# Patient Record
Sex: Female | Born: 1950 | Race: White | Hispanic: No | Marital: Married | Smoking: Never smoker
Health system: Southern US, Community
[De-identification: ages and names within clinical notes are randomized; demographics above are authoritative.]

## PROBLEM LIST (undated history)

## (undated) DIAGNOSIS — E7800 Pure hypercholesterolemia, unspecified: Secondary | ICD-10-CM

## (undated) HISTORY — PX: OTHER SURGICAL HISTORY: SHX169

## (undated) HISTORY — PX: CHOLECYSTECTOMY: SHX55

## (undated) HISTORY — PX: ABDOMINAL HYSTERECTOMY: SHX81

## (undated) HISTORY — PX: BACK SURGERY: SHX140

---

## 2012-12-28 ENCOUNTER — Emergency Department (HOSPITAL_COMMUNITY)
Admission: EM | Admit: 2012-12-28 | Discharge: 2012-12-28 | Disposition: A | Discharge status: Home / Self Care | Payer: Medicare PPO | Attending: Emergency Medicine | Admitting: Emergency Medicine

## 2012-12-28 ENCOUNTER — Encounter (HOSPITAL_COMMUNITY): Payer: Self-pay | Admitting: Emergency Medicine

## 2012-12-28 DIAGNOSIS — S61012A Laceration without foreign body of left thumb without damage to nail, initial encounter: Secondary | ICD-10-CM

## 2012-12-28 DIAGNOSIS — Y92009 Unspecified place in unspecified non-institutional (private) residence as the place of occurrence of the external cause: Secondary | ICD-10-CM | POA: Insufficient documentation

## 2012-12-28 DIAGNOSIS — Z79899 Other long term (current) drug therapy: Secondary | ICD-10-CM | POA: Insufficient documentation

## 2012-12-28 DIAGNOSIS — S61209A Unspecified open wound of unspecified finger without damage to nail, initial encounter: Secondary | ICD-10-CM | POA: Insufficient documentation

## 2012-12-28 DIAGNOSIS — E7800 Pure hypercholesterolemia, unspecified: Secondary | ICD-10-CM | POA: Insufficient documentation

## 2012-12-28 DIAGNOSIS — Y9389 Activity, other specified: Secondary | ICD-10-CM | POA: Insufficient documentation

## 2012-12-28 DIAGNOSIS — W268XXA Contact with other sharp object(s), not elsewhere classified, initial encounter: Secondary | ICD-10-CM | POA: Insufficient documentation

## 2012-12-28 HISTORY — DX: Pure hypercholesterolemia, unspecified: E78.00

## 2012-12-28 MED ORDER — LIDOCAINE-EPINEPHRINE (PF) 2 %-1:200000 IJ SOLN
INTRAMUSCULAR | Status: AC
  Administered 2012-12-28: 06:00:00
  Filled 2012-12-28: qty 20

## 2012-12-28 MED ORDER — TETANUS-DIPHTH-ACELL PERTUSSIS 5-2.5-18.5 LF-MCG/0.5 IM SUSP
0.5000 mL | Freq: Once | INTRAMUSCULAR | Status: AC
  Administered 2012-12-28: 0.5 mL via INTRAMUSCULAR
  Filled 2012-12-28: qty 0.5

## 2012-12-28 NOTE — ED Notes (Addendum)
Wound soaked in normal saline and irrigated with Normal Saline.  Blood under the tissue of small flap, no active bleeding at this time. Soaking in saline with small amount of betadine

## 2012-12-28 NOTE — ED Notes (Signed)
States her son applied the dressing, and bleeding through the bandage,  At present, bandage is intact, and blood can be observed inside the bandage.

## 2012-12-28 NOTE — ED Notes (Signed)
Dermabond placed by Dr Rulon Abide.  Laceration clean and dry when patient was discharged.

## 2012-12-28 NOTE — ED Notes (Signed)
Patient states she lacerated her left thumb last night while slicing a ham.

## 2013-01-03 NOTE — ED Provider Notes (Signed)
History     CSN: [id number]  Arrival date & time 12/28/12  4098   First MD Initiated Contact with Patient 12/28/12 [phone]      Chief Complaint  Patient presents with  . Laceration   HPI patient under a significant amount of stress after her mother died earlier in the day in the ICU, presents with a laceration to the left thumb.  Pain is mild to moderate, throbbing, hemostatic, it did bleed quite a bit at home she says. She has no loss of sensation in the thumb. No other alleviating or exacerbating factors and no other associated symptoms.  Past Medical History  Diagnosis Date  . High cholesterol     Past Surgical History  Procedure Laterality Date  . Abdominal hysterectomy    . Cholecystectomy    . Back surgery      No family history on file.  History  Substance Use Topics  . Smoking status: Never Smoker   . Smokeless tobacco: Not on file  . Alcohol Use: No    OB History   Grav Para Term Preterm Abortions TAB SAB Ect Mult Living                  Review of Systems At least 10pt or greater review of systems completed and are negative except where specified in the HPI.  Allergies  Penicillins  Home Medications   Current Outpatient Rx  Name  Route  Sig  Dispense  Refill  . rosuvastatin (CRESTOR) 10 MG tablet   Oral   Take 10 mg by mouth daily.           BP 145/84  Pulse 96  Temp(Src) 98.5 F (36.9 C) (Oral)  Resp 20  Ht 5\' 3"  (1.6 m)  Wt 130 lb (58.968 kg)  BMI 23.03 kg/m2  SpO2 99%  Physical Exam  Nursing notes reviewed.  Electronic medical record reviewed. VITAL SIGNS:   Filed Vitals:   12/28/12 0406  BP: 145/84  Pulse: 96  Temp: 98.5 F (36.9 C)  TempSrc: Oral  Resp: 20  Height: 5\' 3"  (1.6 m)  Weight: 130 lb (58.968 kg)  SpO2: 99%   CONSTITUTIONAL: Awake, oriented, appears non-toxic HENT: Atraumatic, normocephalic, oral mucosa pink and moist, airway patent. Nares patent without drainage. External ears normal. EYES: Conjunctiva clear,  EOMI, PERRLA NECK: Trachea midline, non-tender, supple CARDIOVASCULAR: Normal heart rate, Normal rhythm, No murmurs, rubs, gallops PULMONARY/CHEST: Clear to auscultation, no rhonchi, wheezes, or rales. Symmetrical breath sounds. Non-tender. ABDOMINAL: Non-distended, soft, non-tender - no rebound or guarding.  BS normal. NEUROLOGIC: Non-focal, moving all four extremities, no gross sensory or motor deficits. EXTREMITIES: No clubbing, cyanosis, or edema. Thin laceration to the left thumb on the ulnar aspect of the skin adjacent to the nail, centimeter and a half, hemostatic, no foreign body SKIN: Warm, Dry, No erythema, No rash  ED Course  LACERATION REPAIR Date/Time: 12/28/2012 6:05 AM Performed by: Jones Skene Authorized by: Jones Skene Consent: Verbal consent obtained. Risks and benefits: risks, benefits and alternatives were discussed Patient identity confirmed: verbally with patient and arm band Body area: upper extremity Location details: left thumb Laceration length: 1.5 cm Tendon involvement: none Nerve involvement: none Vascular damage: no Anesthesia method: Washed with lidocaine, 2% without epinephrine. Anesthetic total: 3 ml Patient sedated: no Preparation: Patient was prepped and draped in the usual sterile fashion. Irrigation solution: saline Irrigation method: syringe Amount of cleaning: standard Debridement: none Degree of undermining: none Skin closure: glue Approximation: close  Approximation difficulty: simple Patient tolerance: Patient tolerated the procedure well with no immediate complications.   (including critical care time)  Labs Reviewed - No data to display No results found.   1. Thumb laceration, left, initial encounter       MDM  Laceration washed, this is a thin laceration contains mostly epidermis, amenable to using glue.  Precautions from infection given, discharge stable and good condition. Tetanus updated        Jones Skene, MD 01/03/13 1308

## 2013-10-25 ENCOUNTER — Emergency Department (HOSPITAL_COMMUNITY)
Admission: EM | Admit: 2013-10-25 | Discharge: 2013-10-25 | Disposition: A | Discharge status: Home / Self Care | Payer: Medicare PPO | Attending: Emergency Medicine | Admitting: Emergency Medicine

## 2013-10-25 ENCOUNTER — Encounter (HOSPITAL_COMMUNITY): Payer: Self-pay | Admitting: Emergency Medicine

## 2013-10-25 ENCOUNTER — Emergency Department (HOSPITAL_COMMUNITY): Payer: Medicare PPO

## 2013-10-25 DIAGNOSIS — Z880 Allergy status to penicillin: Secondary | ICD-10-CM | POA: Insufficient documentation

## 2013-10-25 DIAGNOSIS — Z79899 Other long term (current) drug therapy: Secondary | ICD-10-CM | POA: Insufficient documentation

## 2013-10-25 DIAGNOSIS — E7800 Pure hypercholesterolemia, unspecified: Secondary | ICD-10-CM | POA: Insufficient documentation

## 2013-10-25 DIAGNOSIS — R55 Syncope and collapse: Secondary | ICD-10-CM | POA: Insufficient documentation

## 2013-10-25 LAB — BASIC METABOLIC PANEL
BUN: 13 mg/dL (ref 6–23)
CALCIUM: 9.2 mg/dL (ref 8.4–10.5)
CO2: 31 meq/L (ref 19–32)
CREATININE: 0.87 mg/dL (ref 0.50–1.10)
Chloride: 102 mEq/L (ref 96–112)
GFR calc Af Amer: 82 mL/min — ABNORMAL LOW (ref >90)
GFR calc non Af Amer: 70 mL/min — ABNORMAL LOW (ref >90)
GLUCOSE: 98 mg/dL (ref 70–99)
Potassium: 4.1 mEq/L (ref 3.7–5.3)
Sodium: 142 mEq/L (ref 137–147)

## 2013-10-25 LAB — URINALYSIS, ROUTINE W REFLEX MICROSCOPIC
BILIRUBIN URINE: NEGATIVE
GLUCOSE, UA: NEGATIVE mg/dL
KETONES UR: NEGATIVE mg/dL
Leukocytes, UA: NEGATIVE
Nitrite: NEGATIVE
PH: 5.5 (ref 5.0–8.0)
Protein, ur: NEGATIVE mg/dL
Specific Gravity, Urine: 1.02 (ref 1.005–1.030)
Urobilinogen, UA: 0.2 mg/dL (ref 0.0–1.0)

## 2013-10-25 LAB — URINE MICROSCOPIC-ADD ON

## 2013-10-25 LAB — CBC WITH DIFFERENTIAL/PLATELET
BASOS ABS: 0 10*3/uL (ref 0.0–0.1)
Basophils Relative: 0 % (ref 0–1)
EOS PCT: 1 % (ref 0–5)
Eosinophils Absolute: 0.1 10*3/uL (ref 0.0–0.7)
HEMATOCRIT: 41.3 % (ref 36.0–46.0)
Hemoglobin: 13.4 g/dL (ref 12.0–15.0)
LYMPHS PCT: 28 % (ref 12–46)
Lymphs Abs: 1.9 10*3/uL (ref 0.7–4.0)
MCH: 30.1 pg (ref 26.0–34.0)
MCHC: 32.4 g/dL (ref 30.0–36.0)
MCV: 92.8 fL (ref 78.0–100.0)
MONO ABS: 0.5 10*3/uL (ref 0.1–1.0)
Monocytes Relative: 7 % (ref 3–12)
Neutro Abs: 4.3 10*3/uL (ref 1.7–7.7)
Neutrophils Relative %: 63 % (ref 43–77)
Platelets: 213 10*3/uL (ref 150–400)
RBC: 4.45 MIL/uL (ref 3.87–5.11)
RDW: 13.3 % (ref 11.5–15.5)
WBC: 6.8 10*3/uL (ref 4.0–10.5)

## 2013-10-25 LAB — TROPONIN I

## 2013-10-25 LAB — GLUCOSE, CAPILLARY: Glucose-Capillary: 85 mg/dL (ref 70–99)

## 2013-10-25 MED ORDER — MECLIZINE HCL 25 MG PO TABS: 25.0000 mg | ORAL_TABLET | Freq: Three times a day (TID) | ORAL | Status: AC | PRN

## 2013-10-25 NOTE — ED Notes (Signed)
EDP is speaking with pt

## 2013-10-25 NOTE — ED Notes (Signed)
Pt c/o dizziness since Sunday night.  Also reports r sided neck pain for over 1 week.  Reports has had 3 neck surgeries.  Pt has been taking antivert at home.  Pt says this am bent over to wrap a towel around her head and "things went black" and fell to her knees.  Denies any cp or n/v.

## 2013-10-25 NOTE — ED Provider Notes (Signed)
CSN: [id number]     Arrival date & time 10/25/13  1344 History  This chart was scribed for Donnetta Hutching, MD by Leone Payor, ED Scribe. This patient was seen in room [facility]05[facility] and the patient's care was started 3:30 PM.    Chief Complaint  Patient presents with  . Dizziness      The history is provided by the patient. No language interpreter was used.    HPI Comments: Rachel Allen is a 63 y.o. female who presents to the Emergency Department complaining of intermittent episodes of dizziness that began 3 days ago. She reports having an episode after showering when she tilted her head forward to wrap her hair. She reports falling to her knees brief amount of time, but quickly recovered.. She had another episode after waking in the middle of the night. She reports becoming dizzy when she had to give a urine sample in the ED. Nursing note states pt has been taking Antivert at home. She has a history of 3 different neck surgeries located at C4/5, C5/6, and C6/7. She denies chest pain, SOB, nausea, vomiting, extremity weakness, other neurological deficits.   Past Medical History  Diagnosis Date  . High cholesterol    Past Surgical History  Procedure Laterality Date  . Abdominal hysterectomy    . Cholecystectomy    . Back surgery    . Neck surgery x 3     No family history on file. History  Substance Use Topics  . Smoking status: Never Smoker   . Smokeless tobacco: Not on file  . Alcohol Use: No   OB History   Grav Para Term Preterm Abortions TAB SAB Ect Mult Living                 Review of Systems  A complete 10 system review of systems was obtained and all systems are negative except as noted in the HPI and PMH.    Allergies  Penicillins  Home Medications   Current Outpatient Rx  Name  Route  Sig  Dispense  Refill  . PRESCRIPTION MEDICATION      as needed (Cortisone Injection in Shoulder).         . rosuvastatin (CRESTOR) 10 MG tablet   Oral   Take 10 mg by  mouth daily.         Marland Kitchen venlafaxine XR (EFFEXOR-XR) 75 MG 24 hr capsule   Oral   Take 75 mg by mouth daily with breakfast.          BP 109/59  Pulse 73  Temp(Src) 97.8 F (36.6 C) (Oral)  Resp 18  Ht 5\' 4"  (1.626 m)  Wt 125 lb (56.7 kg)  BMI 21.45 kg/m2  SpO2 99% Physical Exam  Nursing note and vitals reviewed. Constitutional: She is oriented to person, place, and time. She appears well-developed and well-nourished.  Appears pale  HENT:  Head: Normocephalic and atraumatic.  Eyes: Conjunctivae and EOM are normal. Pupils are equal, round, and reactive to light.  Neck: Normal range of motion. Neck supple.  Cardiovascular: Normal rate, regular rhythm and normal heart sounds.   Pulmonary/Chest: Effort normal and breath sounds normal.  Abdominal: Soft. Bowel sounds are normal.  Musculoskeletal: Normal range of motion.  Neurological: She is alert and oriented to person, place, and time.  Skin: Skin is warm and dry.  Psychiatric: She has a normal mood and affect. Her behavior is normal.    ED Course  Procedures (including critical care time)  DIAGNOSTIC STUDIES: Oxygen Saturation is 100% on RA, normal by my interpretation.    COORDINATION OF CARE: 3:34 PM Will order head CT and lab work. Discussed treatment plan with pt at bedside and pt agreed to plan.   Labs Review Labs Reviewed  BASIC METABOLIC PANEL - Abnormal; Notable for the following:    GFR calc non Af Amer 70 (*)    GFR calc Af Amer 82 (*)    All other components within normal limits  URINALYSIS, ROUTINE W REFLEX MICROSCOPIC - Abnormal; Notable for the following:    Hgb urine dipstick TRACE (*)    All other components within normal limits  GLUCOSE, CAPILLARY  CBC WITH DIFFERENTIAL  TROPONIN I  URINE MICROSCOPIC-ADD ON   Imaging Review Ct Head Wo Contrast  10/25/2013   CLINICAL DATA:  Unexplained syncope  EXAM: CT HEAD WITHOUT CONTRAST  TECHNIQUE: Contiguous axial images were obtained from the base of the  skull through the vertex without intravenous contrast.  COMPARISON:  None.  FINDINGS: Ventricle size is normal. Chronic microvascular ischemic changes in the white matter. No acute infarct, hemorrhage, or mass. Calvarium intact.  IMPRESSION: Mild chronic microvascular ischemia.  No acute abnormality.   Electronically Signed   By: Marlan Palauharles  Clark M.D.   On: 10/25/2013 17:05    EKG Interpretation    Date/Time:  Wednesday October 25 2013 13:47:35 EST Ventricular Rate:  81 PR Interval:  134 QRS Duration: 84 QT Interval:  374 QTC Calculation: 434 R Axis:   66 Text Interpretation:  Normal sinus rhythm Normal ECG No previous ECGs available Confirmed by Aliegha Paullin  MD, Demaya Hardge ([id number]) on 10/25/2013 4:56:19 PM            MDM   Final diagnoses:  Syncope    Patient appears well. No abnormalities on physical exam. Screening tests negative. Patient has primary care followup.  I personally performed the services described in this documentation, which was scribed in my presence. The recorded information has been reviewed and is accurate.   Donnetta HutchingBrian Jisselle Poth, MD 10/25/13 [phone]

## 2013-10-25 NOTE — Discharge Instructions (Signed)
Tests show no obvious causes for your symptoms. Increase fluids. Eat regular meals. Followup your primary care Dr.

## 2014-06-26 IMAGING — CT CT HEAD W/O CM
1 series · 16 of 30 positions shown, 20 images · non-contrast
Comparison: None.

CLINICAL DATA: Unexplained syncope

EXAM:
CT HEAD WITHOUT CONTRAST
TECHNIQUE: Contiguous axial images were obtained from the base of the skull
through the vertex without intravenous contrast.

[Series 2: headseq 4.8 h37s · axial · 0.43mm/px · z∈[+98,+258]mm · 16 of 36 slices shown, 20 images]
[im 2/36  brain]
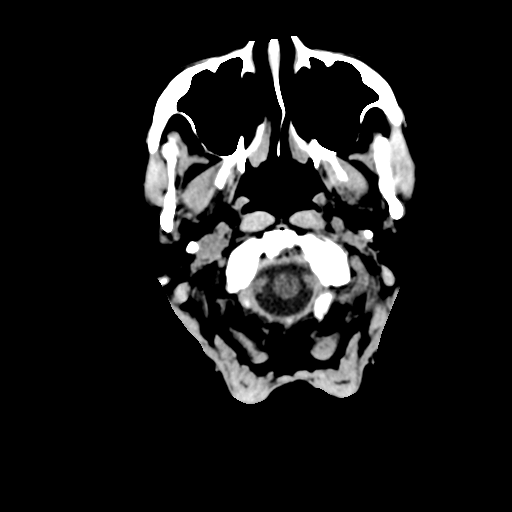
[im 2/36  bone]
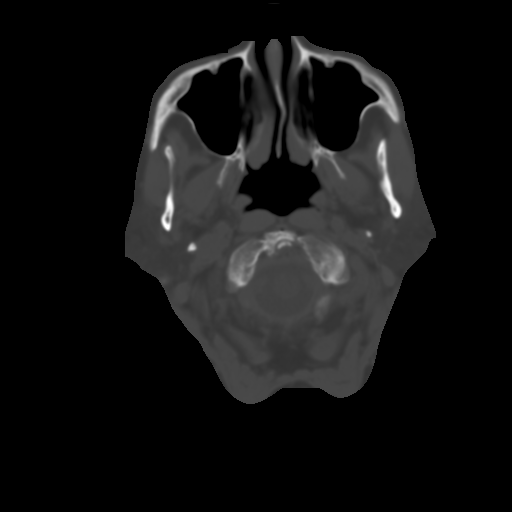
[im 4/36  brain]
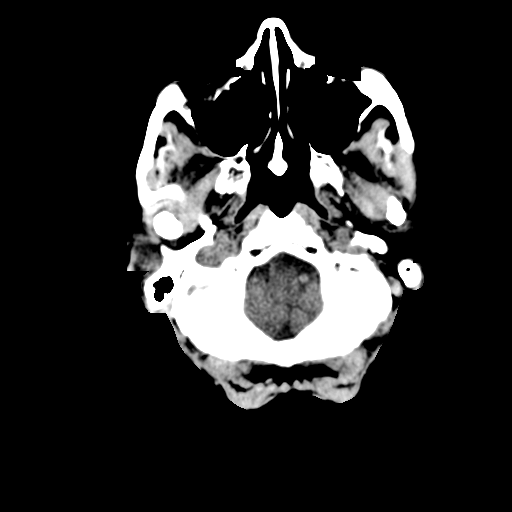
[im 7/36  brain]
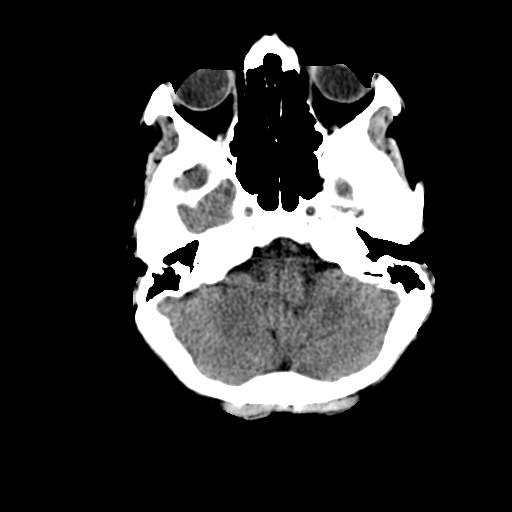
[im 9/36  brain]
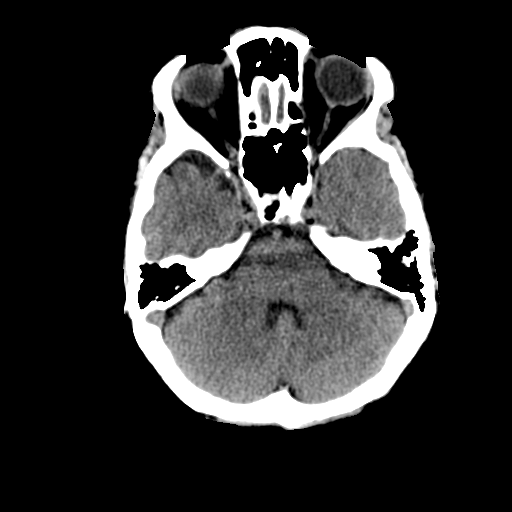
[im 10/36  brain]
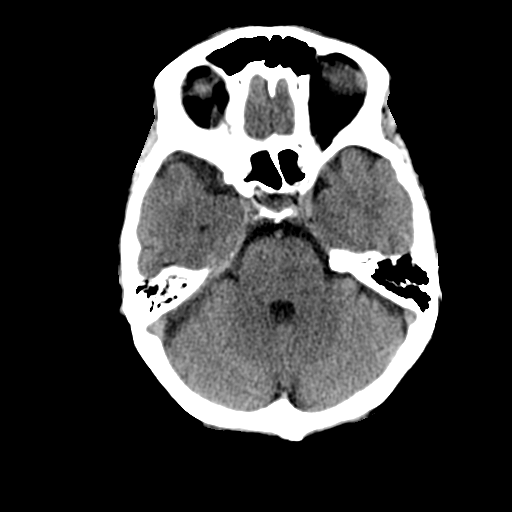
[im 10/36  bone]
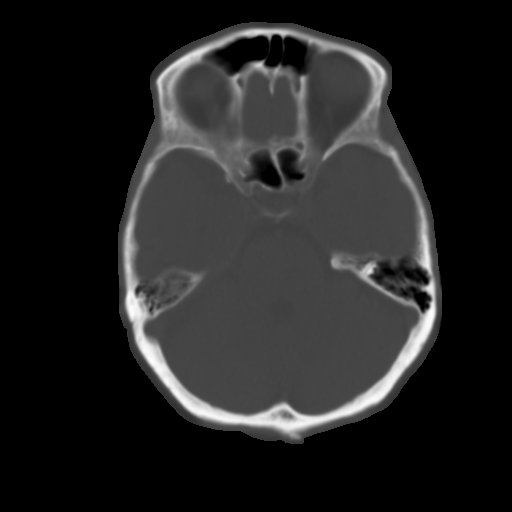
[im 13/36  brain]
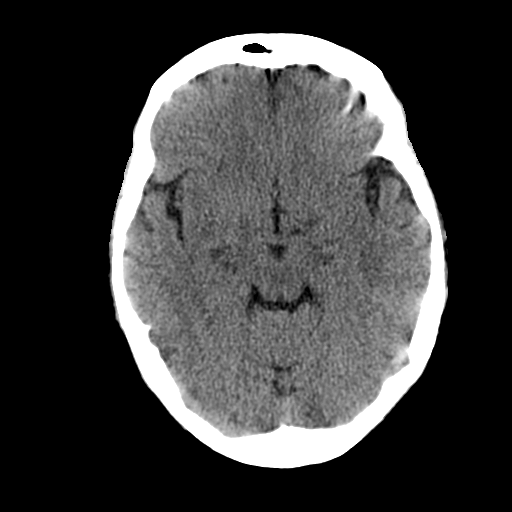
[im 15/36  brain]
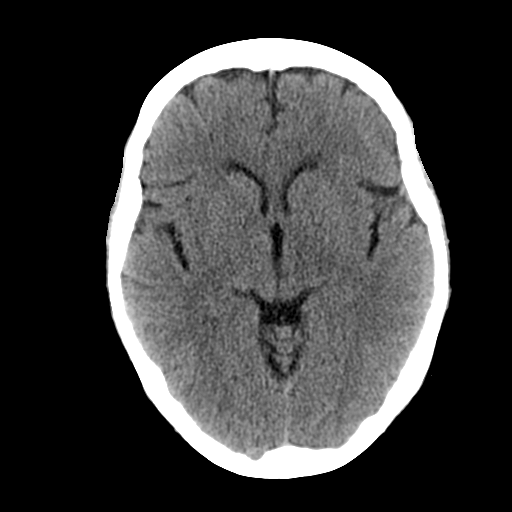
[im 17/36  brain]
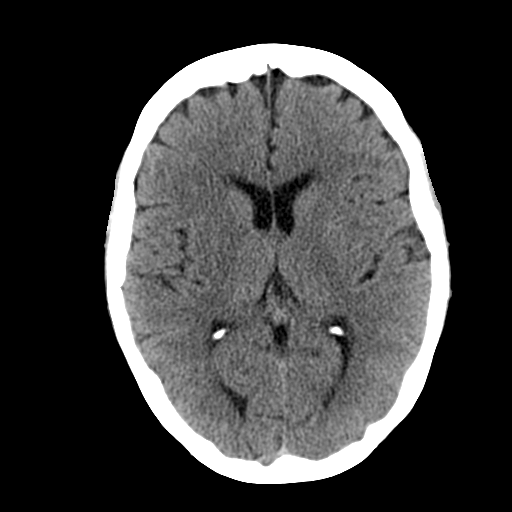
[im 19/36  brain]
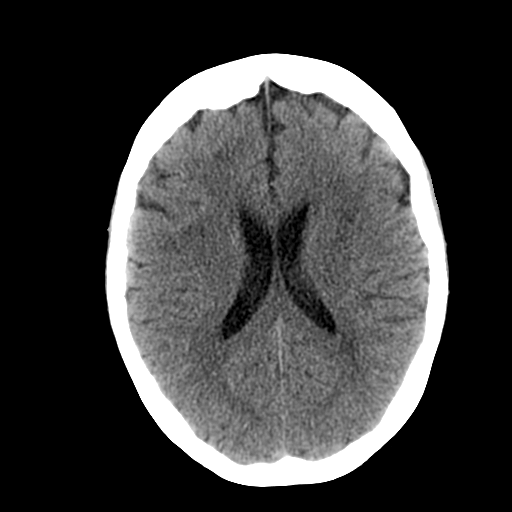
[im 19/36  bone]
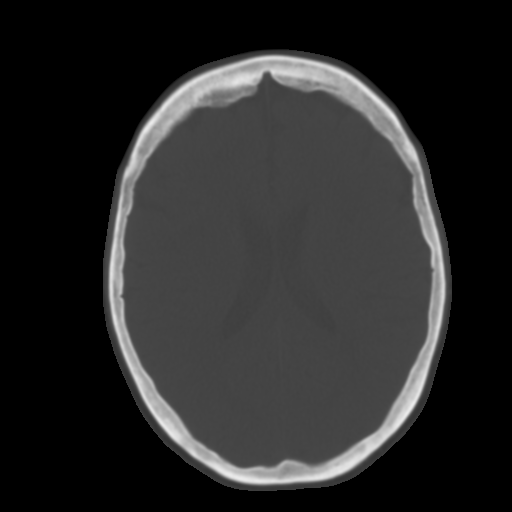
[im 21/36  brain]
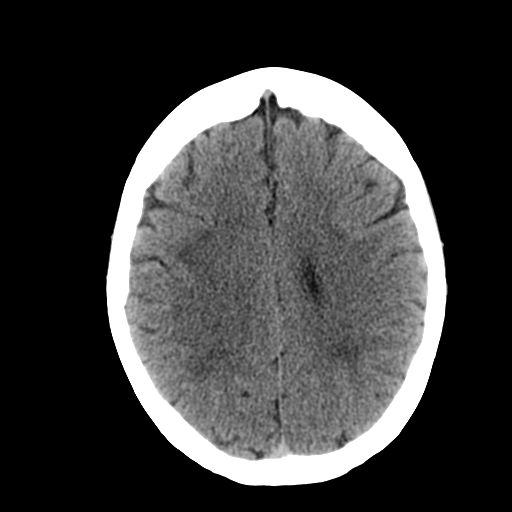
[im 23/36  brain]
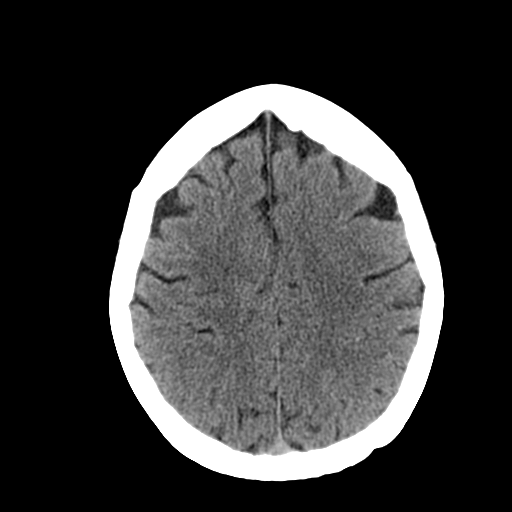
[im 26/36  brain]
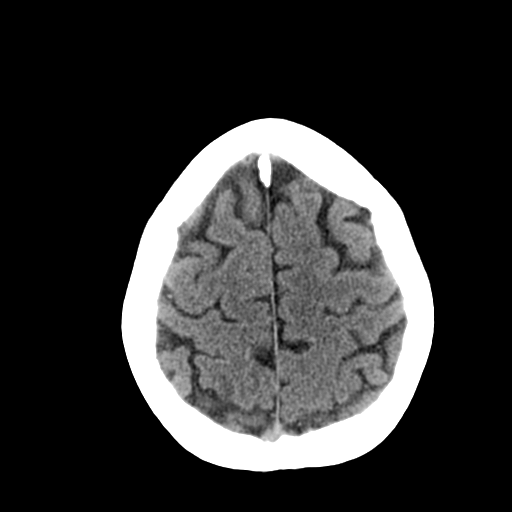
[im 27/36  brain]
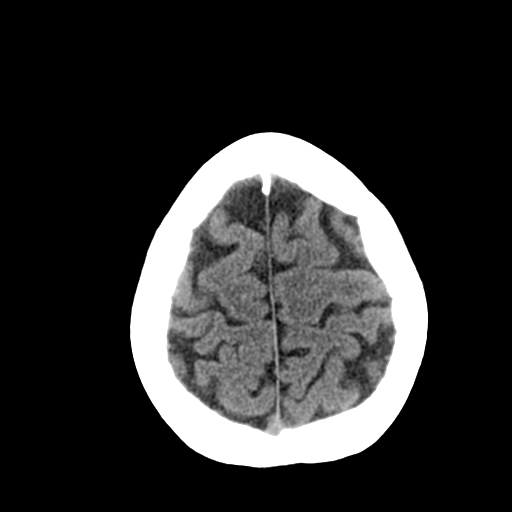
[im 27/36  bone]
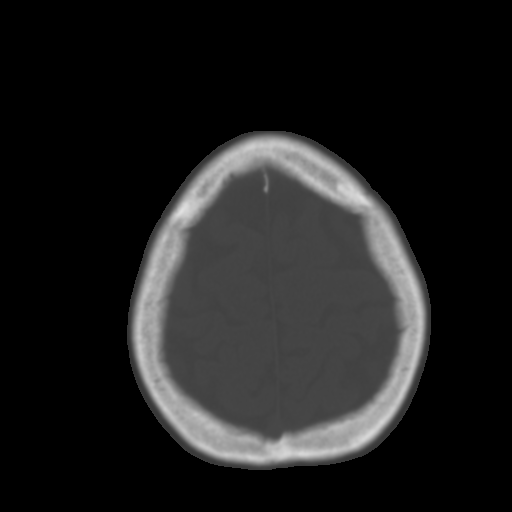
[im 29/36  brain]
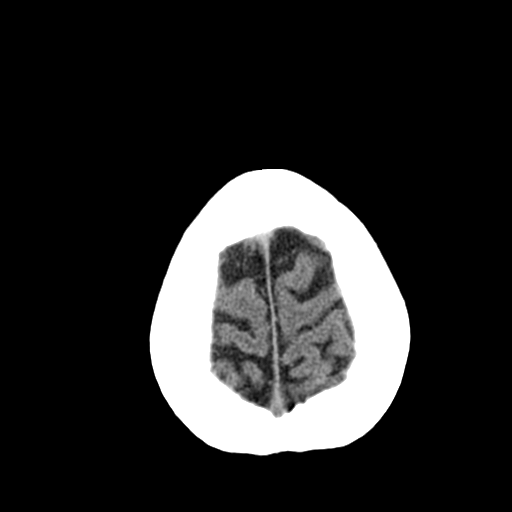
[im 32/36  brain]
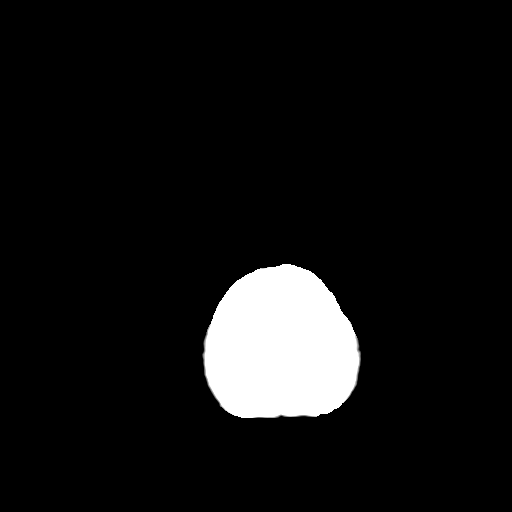
[im 34/36  brain]
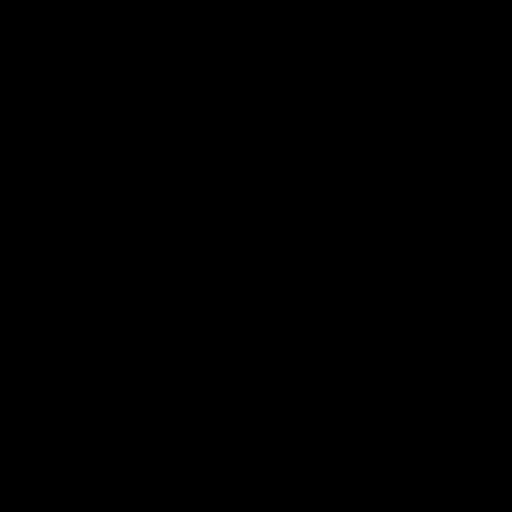

[16 of 30 positions shown; findings below may reference images not displayed]

FINDINGS: Ventricle size is normal. Chronic microvascular ischemic changes in
the white matter. No acute infarct, hemorrhage, or mass. Calvarium
intact.
IMPRESSION: Mild chronic microvascular ischemia.  No acute abnormality.

## 2021-12-10 ENCOUNTER — Other Ambulatory Visit: Payer: Self-pay

## 2023-04-13 ENCOUNTER — Other Ambulatory Visit: Payer: Self-pay
